# Patient Record
Sex: Male | Born: 1995 | Race: Black or African American | Hispanic: No | Marital: Single | Smoking: Smoker, current status unknown
Health system: Southern US, Community
[De-identification: ages and names within clinical notes are randomized; demographics above are authoritative.]

---

## 2017-07-02 ENCOUNTER — Emergency Department (HOSPITAL_COMMUNITY)
Admission: EM | Admit: 2017-07-02 | Discharge: 2017-07-02 | Disposition: A | Discharge status: Home / Self Care | Payer: Self-pay | Attending: Emergency Medicine | Admitting: Emergency Medicine

## 2017-07-02 ENCOUNTER — Other Ambulatory Visit: Payer: Self-pay

## 2017-07-02 ENCOUNTER — Emergency Department (HOSPITAL_COMMUNITY): Payer: Self-pay

## 2017-07-02 ENCOUNTER — Encounter (HOSPITAL_COMMUNITY): Payer: Self-pay

## 2017-07-02 DIAGNOSIS — N50819 Testicular pain, unspecified: Secondary | ICD-10-CM

## 2017-07-02 DIAGNOSIS — R369 Urethral discharge, unspecified: Secondary | ICD-10-CM

## 2017-07-02 DIAGNOSIS — R360 Urethral discharge without blood: Secondary | ICD-10-CM | POA: Insufficient documentation

## 2017-07-02 DIAGNOSIS — F17200 Nicotine dependence, unspecified, uncomplicated: Secondary | ICD-10-CM | POA: Insufficient documentation

## 2017-07-02 DIAGNOSIS — N50811 Right testicular pain: Secondary | ICD-10-CM | POA: Insufficient documentation

## 2017-07-02 LAB — BASIC METABOLIC PANEL
Anion gap: 8 (ref 5–15)
BUN: 8 mg/dL (ref 6–20)
CALCIUM: 9.6 mg/dL (ref 8.9–10.3)
CHLORIDE: 106 mmol/L (ref 101–111)
CO2: 23 mmol/L (ref 22–32)
CREATININE: 0.96 mg/dL (ref 0.61–1.24)
GFR calc Af Amer: >60 mL/min (ref >60)
GFR calc non Af Amer: >60 mL/min (ref >60)
GLUCOSE: 104 mg/dL — AB (ref 65–99)
Potassium: 4.5 mmol/L (ref 3.5–5.1)
Sodium: 137 mmol/L (ref 135–145)

## 2017-07-02 LAB — CBC WITH DIFFERENTIAL/PLATELET
Basophils Absolute: 0 10*3/uL (ref 0.0–0.1)
Basophils Relative: 1 %
EOS ABS: 0.1 10*3/uL (ref 0.0–0.7)
Eosinophils Relative: 3 %
HEMATOCRIT: 42.9 % (ref 39.0–52.0)
HEMOGLOBIN: 14 g/dL (ref 13.0–17.0)
LYMPHS ABS: 1.6 10*3/uL (ref 0.7–4.0)
Lymphocytes Relative: 41 %
MCH: 28.1 pg (ref 26.0–34.0)
MCHC: 32.6 g/dL (ref 30.0–36.0)
MCV: 86.1 fL (ref 78.0–100.0)
MONO ABS: 0.3 10*3/uL (ref 0.1–1.0)
MONOS PCT: 7 %
Neutro Abs: 1.8 10*3/uL (ref 1.7–7.7)
Neutrophils Relative %: 48 %
Platelets: 292 10*3/uL (ref 150–400)
RBC: 4.98 MIL/uL (ref 4.22–5.81)
RDW: 13.8 % (ref 11.5–15.5)
WBC: 3.8 10*3/uL — ABNORMAL LOW (ref 4.0–10.5)

## 2017-07-02 LAB — URINALYSIS, ROUTINE W REFLEX MICROSCOPIC
BILIRUBIN URINE: NEGATIVE
GLUCOSE, UA: NEGATIVE mg/dL
HGB URINE DIPSTICK: NEGATIVE
KETONES UR: NEGATIVE mg/dL
Leukocytes, UA: NEGATIVE
Nitrite: NEGATIVE
PH: 7 (ref 5.0–8.0)
Protein, ur: NEGATIVE mg/dL
SPECIFIC GRAVITY, URINE: 1.016 (ref 1.005–1.030)

## 2017-07-02 MED ORDER — LIDOCAINE HCL (PF) 1 % IJ SOLN: 2.0000 mL | Freq: Once | INTRAMUSCULAR | Status: DC

## 2017-07-02 MED ORDER — AZITHROMYCIN 250 MG PO TABS
1000.0000 mg | ORAL_TABLET | Freq: Once | ORAL | Status: AC
  Administered 2017-07-02: 1000 mg via ORAL
  Filled 2017-07-02: qty 4

## 2017-07-02 MED ORDER — SODIUM CHLORIDE 0.9 % IV SOLN: 1.0000 g | Freq: Once | INTRAVENOUS | Status: DC

## 2017-07-02 MED ORDER — LIDOCAINE HCL (PF) 1 % IJ SOLN
2.0000 mL | Freq: Once | INTRAMUSCULAR | Status: AC
  Administered 2017-07-02: 2 mL
  Filled 2017-07-02: qty 5

## 2017-07-02 MED ORDER — CEFTRIAXONE SODIUM 250 MG IJ SOLR
250.0000 mg | Freq: Once | INTRAMUSCULAR | Status: AC
  Administered 2017-07-02: 250 mg via INTRAMUSCULAR
  Filled 2017-07-02: qty 250

## 2017-07-02 NOTE — ED Provider Notes (Signed)
Patient placed in Quick Look pathway, seen and evaluated   Chief Complaint: Testicular pain and swelling.  HPI:   Patient reports 1 day of lower abdominal pain that is radiated to his testicles.  Reports bilateral testicle pain with mild swelling.  Denies any recent trauma.  Patient reports some discharge this morning.  He states he is sexually active and concern for STD.  Patient reports using protection.  Denies any dysuria.  Denies any associated nausea, vomiting, fevers, chills.  Denies any associated urinary symptoms including hematuria  ROS: Testicular pain, penile discharge (one)  Physical Exam:   Gen: No distress  Neuro: Awake and Alert  Skin: Warm    Focused Exam: Chaperone present for exam. Circumcisedmale. No penile discharge, erythema, tenderness, lesion, or rash. 2 descended testes without swelling, lesions or rash. No inguinal lymphadenopathy or hernia.  Mild pain noted to palpation without any edema.  No focal abdominal tenderness.  No CVA tenderness.    Initiation of care has begun. The patient has been counseled on the process, plan, and necessity for staying for the completion/evaluation, and the remainder of the medical screening examination   Discussed with the patient that exiting the department prior to completion of the work-up is AMA and there is no guarantee that there are no emergency medical conditions present.     Rise MuLeaphart, Dayvian Blixt T, PA-C 07/02/17 1524    Tegeler, Canary Brimhristopher J, MD 07/03/17 [phone]

## 2017-07-02 NOTE — ED Triage Notes (Signed)
Pt presents with complaints of testicle pain more on the left than the right denies any trauma, swelling or discoloration. NAD in triage

## 2017-07-02 NOTE — ED Provider Notes (Signed)
[facility] EMERGENCY DEPARTMENT Provider Note   CSN: [id number] Arrival date & time: 07/02/17  1428     History   Chief Complaint Chief Complaint  Patient presents with  . Testicle Pain    HPI Willie Montes is a 22 y.o. male.  HPI   22 year old generally healthy male presenting complaining of abdominal pain that radiates to his testicle.  The pain started approximately 1 day ago, radiates to bilateral testicle with mild swelling.  He also noticed some penile discharge this morning.  He is sexually active and was concerned for potential sexually transmitted infection.  Denies any recent trauma.  He has been using protection.  Admits to having sexual activities with at least 8 partners within the past 6 months.  Denies burning urination or urinary frequency urgency.  No report of fever chills nausea vomiting or back pain hematuria or rash.  Denies any prior STI.  No history of kidney stone.  History reviewed. No pertinent past medical history.  There are no active problems to display for this patient.   History reviewed. No pertinent surgical history.     Home Medications    Prior to Admission medications   Not on File    Family History No family history on file.  Social History Social History   Tobacco Use  . Smoking status: Smoker, Current Status Unknown  . Smokeless tobacco: Current User  Substance Use Topics  . Alcohol use: No    Frequency: Never  . Drug use: No     Allergies   Patient has no known allergies.   Review of Systems Review of Systems  All other systems reviewed and are negative.    Physical Exam Updated Vital Signs BP 124/65   Pulse 64   Temp 100.1 F (37.8 C) (Oral)   Resp 16   Wt 81.6 kg (180 lb)   SpO2 100%   Physical Exam  Constitutional: He appears well-developed and well-nourished. No distress.  HENT:  Head: Atraumatic.  Eyes: Conjunctivae are normal.  Neck: Neck supple.  Cardiovascular: Normal rate  and regular rhythm.  Pulmonary/Chest: Effort normal and breath sounds normal.  Abdominal: Soft. He exhibits no distension. There is no tenderness.  Genitourinary:  Genitourinary Comments: Chaperone present during exam.  No inguinal lymphadenopathy or inguinal hernia noted.  Normal circumcised penis with discharge noted at the tip of the meatus.  Testicles are nontender with normal lie.  Scrotum is soft, no perineal pain.  No skin lesions  Neurological: He is alert.  Skin: No rash noted.  Psychiatric: He has a normal mood and affect.  Nursing note and vitals reviewed.    ED Treatments / Results  Labs (all labs ordered are listed, but only abnormal results are displayed) Labs Reviewed  BASIC METABOLIC PANEL - Abnormal; Notable for the following components:      Result Value   Glucose, Bld 104 (*)    All other components within normal limits  CBC WITH DIFFERENTIAL/PLATELET - Abnormal; Notable for the following components:   WBC 3.8 (*)    All other components within normal limits  URINALYSIS, ROUTINE W REFLEX MICROSCOPIC  RPR  HIV ANTIBODY (ROUTINE TESTING)  GC/CHLAMYDIA PROBE AMP ([place]) NOT AT [facility]    EKG  EKG Interpretation None       Radiology US Scrotum W/doppler  Result Date: 07/02/2017 CLINICAL DATA:  Testicular pain EXAM: SCROTAL ULTRASOUND DOPPLER ULTRASOUND OF THE TESTICLES TECHNIQUE: Complete ultrasound examination of the testicles, epididymis, and other  scrotal structures was performed. Color and spectral Doppler ultrasound were also utilized to evaluate blood flow to the testicles. COMPARISON:  None. FINDINGS: Right testicle Measurements: 4.5 x 2.1 x 3.4 cm . No mass or microlithiasis visualized. Left testicle Measurements: 3.9 x 2.2 x 2.9 cm. No mass or microlithiasis visualized. Right epididymis:  Normal in size and appearance. Left epididymis:  Normal in size and appearance. Hydrocele:  None visualized. Varicocele:  None visualized. Pulsed Doppler  interrogation of both testes demonstrates normal low resistance arterial and venous waveforms bilaterally. IMPRESSION: Normal scrotal ultrasound. Electronically Signed   By: Deatra RobinsonKevin  Herman M.D.   On: 07/02/2017 16:04    Procedures Procedures (including critical care time)  Medications Ordered in ED Medications - No data to display   Initial Impression / Assessment and Plan / ED Course  I have reviewed the triage vital signs and the nursing notes.  Pertinent labs & imaging results that were available during my care of the patient were reviewed by me and considered in my medical decision making (see chart for details).     BP 124/65   Pulse 64   Temp 100.1 F (37.8 C) (Oral)   Resp 16   Wt 81.6 kg (180 lb)   SpO2 100%    Final Clinical Impressions(s) / ED Diagnoses   Final diagnoses:  Penile discharge, without blood  Testicular pain    ED Discharge Orders    None     6:21 PM Patient with history of risky sexual behaviors here with lower abdominal discomfort and penile discharge.  Abdominal exam is unremarkable.  He does have some penile discharge noted on exam.  Urine without signs of urinary tract infection, labs are reassuring, scrotal ultrasound without concerning feature.  Patient receive Rocephin and Zithromax for prophylactic treatment of STI.  Recommend sexual abstinence, and notify partner if test positive.   Fayrene Helperran, Karandeep Resende, PA-C 07/02/17 1825    Tegeler, Canary Brimhristopher J, MD 07/03/17 [phone]

## 2017-07-02 NOTE — ED Notes (Signed)
Pt was discharged earlier.  Still in computer.

## 2017-07-02 NOTE — Discharge Instructions (Signed)
You have been evaluated for potential sexually transmitted infection.  You will be notify in the next several days if tested positive for infection.  Make sure to notify partner to get tested if you are positive.  Avoid sexual activity until your symptoms completely resolved.

## 2017-07-03 LAB — HIV ANTIBODY (ROUTINE TESTING W REFLEX): HIV SCREEN 4TH GENERATION: NONREACTIVE

## 2017-07-04 LAB — RPR, QUANT+TP ABS (REFLEX): T Pallidum Abs: NEGATIVE

## 2017-07-04 LAB — RPR: RPR: REACTIVE — AB

## 2017-07-05 LAB — GC/CHLAMYDIA PROBE AMP (~~LOC~~) NOT AT ARMC
Chlamydia: NEGATIVE
NEISSERIA GONORRHEA: NEGATIVE

## 2018-10-06 ENCOUNTER — Emergency Department (HOSPITAL_COMMUNITY)
Admission: EM | Admit: 2018-10-06 | Discharge: 2018-10-07 | Disposition: A | Discharge status: Home / Self Care | Payer: Self-pay | Attending: Emergency Medicine | Admitting: Emergency Medicine

## 2018-10-06 ENCOUNTER — Encounter (HOSPITAL_COMMUNITY): Payer: Self-pay

## 2018-10-06 ENCOUNTER — Emergency Department (HOSPITAL_COMMUNITY): Payer: Self-pay

## 2018-10-06 ENCOUNTER — Other Ambulatory Visit: Payer: Self-pay

## 2018-10-06 DIAGNOSIS — R0602 Shortness of breath: Secondary | ICD-10-CM | POA: Insufficient documentation

## 2018-10-06 DIAGNOSIS — F17200 Nicotine dependence, unspecified, uncomplicated: Secondary | ICD-10-CM | POA: Insufficient documentation

## 2018-10-06 DIAGNOSIS — F17228 Nicotine dependence, chewing tobacco, with other nicotine-induced disorders: Secondary | ICD-10-CM | POA: Insufficient documentation

## 2018-10-06 DIAGNOSIS — R002 Palpitations: Secondary | ICD-10-CM | POA: Insufficient documentation

## 2018-10-06 LAB — BASIC METABOLIC PANEL
Anion gap: 12 (ref 5–15)
BUN: 10 mg/dL (ref 6–20)
CO2: 20 mmol/L — ABNORMAL LOW (ref 22–32)
Calcium: 9.9 mg/dL (ref 8.9–10.3)
Chloride: 104 mmol/L (ref 98–111)
Creatinine, Ser: 1.01 mg/dL (ref 0.61–1.24)
GFR calc Af Amer: >60 mL/min (ref >60)
GFR calc non Af Amer: >60 mL/min (ref >60)
Glucose, Bld: 180 mg/dL — ABNORMAL HIGH (ref 70–99)
Potassium: 3.4 mmol/L — ABNORMAL LOW (ref 3.5–5.1)
Sodium: 136 mmol/L (ref 135–145)

## 2018-10-06 LAB — CBC
HCT: 44.8 % (ref 39.0–52.0)
Hemoglobin: 15 g/dL (ref 13.0–17.0)
MCH: 28.1 pg (ref 26.0–34.0)
MCHC: 33.5 g/dL (ref 30.0–36.0)
MCV: 84.1 fL (ref 80.0–100.0)
Platelets: 337 10*3/uL (ref 150–400)
RBC: 5.33 MIL/uL (ref 4.22–5.81)
RDW: 12.4 % (ref 11.5–15.5)
WBC: 5.8 10*3/uL (ref 4.0–10.5)
nRBC: 0 % (ref 0.0–0.2)

## 2018-10-06 MED ORDER — SODIUM CHLORIDE 0.9% FLUSH: 3.0000 mL | Freq: Once | INTRAVENOUS | Status: DC

## 2018-10-06 NOTE — ED Triage Notes (Signed)
Pt reports shortness of breath and rapid heart beat intermittently for months. Pt in no distress. States he has some numbness in limbs when this occurs. Pt also reports SOB worsens after eating. Skin warm and dry. Resp e/u at this time. Pt is noted to be tachycardic in triage.

## 2018-10-06 NOTE — ED Notes (Addendum)
Willie Montes, [phone], Mother of the child

## 2018-10-07 LAB — TSH: TSH: 1.72 u[IU]/mL (ref 0.350–4.500)

## 2018-10-07 LAB — T4, FREE: Free T4: 1.14 ng/dL (ref 0.82–1.77)

## 2018-10-07 MED ORDER — LORAZEPAM 0.5 MG PO TABS: 0.5000 mg | ORAL_TABLET | Freq: Three times a day (TID) | ORAL | 0 refills | Status: AC | PRN

## 2018-10-07 NOTE — ED Provider Notes (Signed)
Patient seen/examined in the Emergency Department in conjunction with Advanced Practice Provider [place] Patient reports palpations and SOB Exam : awake/alert, no distress, no added heart sounds, lung sounds CTA-B Plan: continue to monitor Repeat ekg Ambulate with pulse ox If improved he can be discharged    Zadie Rhine, MD 10/07/18 0234

## 2018-10-07 NOTE — ED Notes (Addendum)
Pt pulse oximetry was 98-100% while ambulating.

## 2018-10-07 NOTE — ED Provider Notes (Signed)
[facility] Provider [facility]   CSN: [id number] Arrival date & time: 10/06/18  2200    History   Chief Complaint Chief Complaint  Patient presents with  . Shortness of Breath    HPI Willie Montes is a 23 y.o. male.     Patient to ED for evaluation of symptoms of palpitations described as episodes of heart racing and SOB. They can occur at any time and last for varying amounts of time, from a few minutes to a few hours. He states he had similar symptoms 1-2 months ago that resolved and started again over the last several days. No fever, cough, congestion, vomiting, syncope or near syncope, headache. He stopped smoking both tobacco and marijuana in the past week. No other drug use. He is not taking any OTC medications and reports his caffeine use is minimal. No history of anxiety or panic.   The history is provided by the patient. No language interpreter was used.  Shortness of Breath  Associated symptoms: no diaphoresis, no fever, no headaches and no vomiting     History reviewed. No pertinent past medical history.  There are no active problems to display for this patient.   History reviewed. No pertinent surgical history.      Home Medications    Prior to Admission medications   Not on File    Family History History reviewed. No pertinent family history.  Social History Social History   Tobacco Use  . Smoking status: Smoker, Current Status Unknown  . Smokeless tobacco: Current User  Substance Use Topics  . Alcohol use: No    Frequency: Never  . Drug use: No     Allergies   Patient has no known allergies.   Review of Systems Review of Systems  Constitutional: Negative for chills, diaphoresis and fever.  HENT: Negative.   Respiratory: Positive for shortness of breath.   Cardiovascular: Positive for palpitations.  Gastrointestinal: Negative.  Negative for vomiting.  Musculoskeletal: Negative.   Neurological: Negative.   Negative for syncope, weakness and headaches.     Physical Exam Updated Vital Signs BP (!) 150/85   Pulse 78   Temp 98.8 F (37.1 C) (Oral)   Resp 13   SpO2 100%   Physical Exam Vitals signs and nursing note reviewed.  Constitutional:      Appearance: He is well-developed. He is not ill-appearing.  HENT:     Head: Normocephalic.  Neck:     Musculoskeletal: Normal range of motion and neck supple.  Cardiovascular:     Rate and Rhythm: Regular rhythm. Tachycardia present.     Heart sounds: No murmur.  Pulmonary:     Effort: Pulmonary effort is normal.     Breath sounds: Normal breath sounds. No wheezing, rhonchi or rales.  Chest:     Chest wall: No tenderness.  Abdominal:     General: Bowel sounds are normal.     Palpations: Abdomen is soft.     Tenderness: There is no abdominal tenderness. There is no guarding or rebound.  Musculoskeletal: Normal range of motion.  Skin:    General: Skin is warm and dry.  Neurological:     General: No focal deficit present.     Mental Status: He is alert and oriented to person, place, and time.      ED Treatments / Results  Labs (all labs ordered are listed, but only abnormal results are displayed) Labs Reviewed  BASIC METABOLIC PANEL - Abnormal; Notable for  the following components:      Result Value   Potassium 3.4 (*)    CO2 20 (*)    Glucose, Bld 180 (*)    All other components within normal limits  CBC  T4, FREE  TSH    EKG EKG Interpretation  Date/Time:  Friday Oct 07 2018 02:51:13 EDT Ventricular Rate:  83 PR Interval:  132 QRS Duration: 91 QT Interval:  361 QTC Calculation: 425 R Axis:   85 Text Interpretation:  Sinus rhythm Right atrial enlargement ST elev, probable normal early repol pattern heart rate is improved Confirmed by Zadie Rhine ([id number]) on 10/07/2018 3:04:41 AM   Radiology Dg Chest 2 View  Result Date: 10/06/2018 CLINICAL DATA:  Shortness of breath for several months EXAM: CHEST - 2 VIEW  COMPARISON:  None. FINDINGS: The heart size and mediastinal contours are within normal limits. Both lungs are clear. The visualized skeletal structures are unremarkable. IMPRESSION: No active cardiopulmonary disease. Electronically Signed   By: Alcide Clever M.D.   On: 10/06/2018 23:06    Procedures Procedures (including critical care time)  Medications Ordered in ED Medications  sodium chloride flush (NS) 0.9 % injection 3 mL (has no administration in time range)     Initial Impression / Assessment and Plan / ED Course  I have reviewed the triage vital signs and the nursing notes.  Pertinent labs & imaging results that were available during my care of the patient were reviewed by me and considered in my medical decision making (see chart for details).        The patient is here for evaluation palpitations and SOB x months intermittently. No pain, fever, cough, congestion. No h/o anxiety. He does not feel he is under stress.   He is tachycardic on arrival, which improves over time. HR 78 on the monitor when I enter the room that increases to 112 during HPI. NAD.   Labs are WNL, no anemia. EKG shows sinus tach. CXR clear. He ambulates without desaturation or significant symptoms of SOB.   He has been seen by Dr. Bebe Shaggy. He is felt appropriate for discharge home. Will provide #5 Ativan 0.5 mg. REfer to cardiology for holter monitoring. Feel symptoms are likely anxiety related.  Final Clinical Impressions(s) / ED Diagnoses   Final diagnoses:  None   1. Palpitations  ED Discharge Orders    None       Elpidio Anis, Cordelia Poche 10/07/18 0404    Zadie Rhine, MD 10/07/18 [phone]

## 2018-10-07 NOTE — Discharge Instructions (Addendum)
Take Ativan if you develop symptoms to see if it helps. You can take one as needed, no more frequently than every 8 hours.   Follow up with cardiology for further evaluation of palpitations.

## 2020-03-14 IMAGING — DX CHEST - 2 VIEW
2 series · 2 of 2 positions shown · non-contrast
Comparison: None.

CLINICAL DATA: Shortness of breath for several months

EXAM:
CHEST - 2 VIEW

[chest pa]
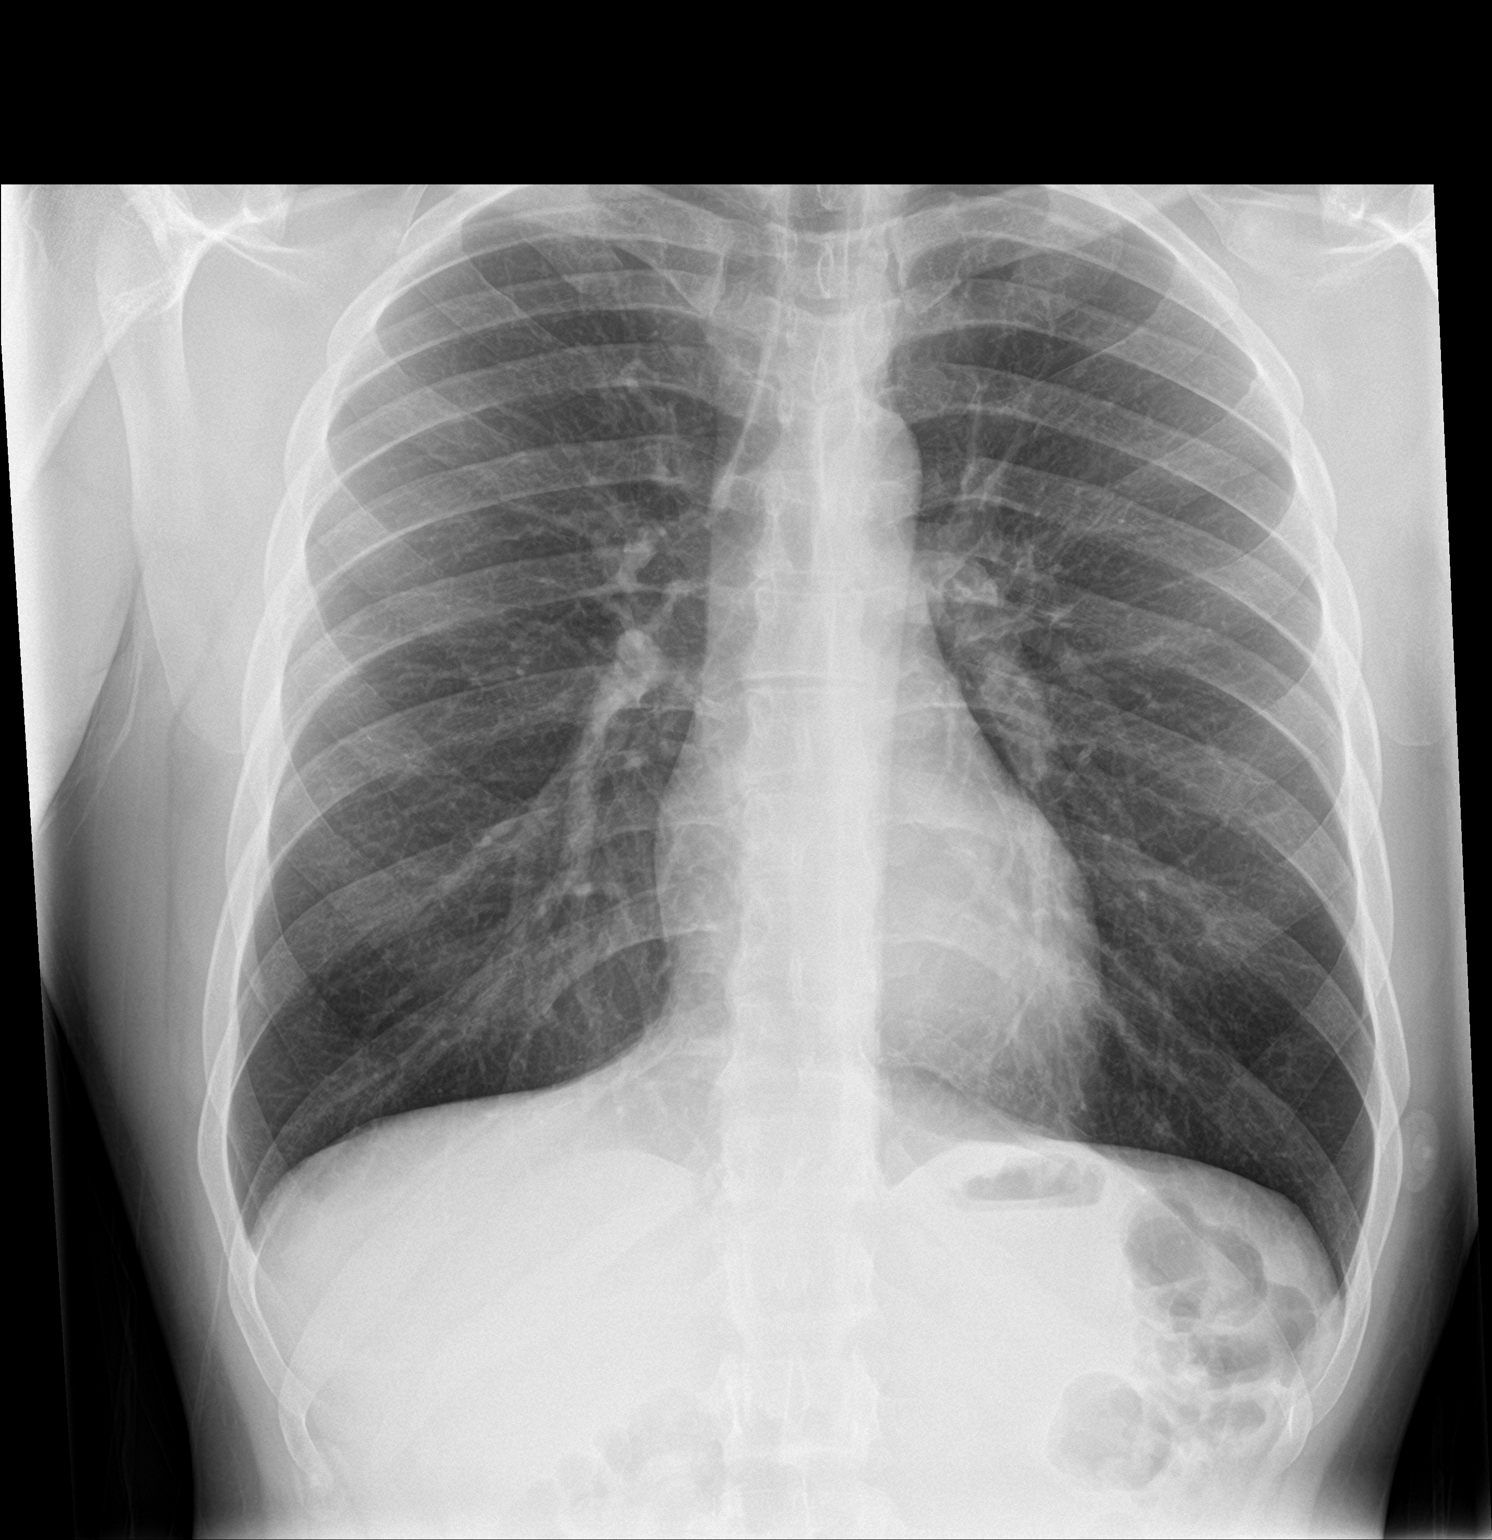

[chest lat]
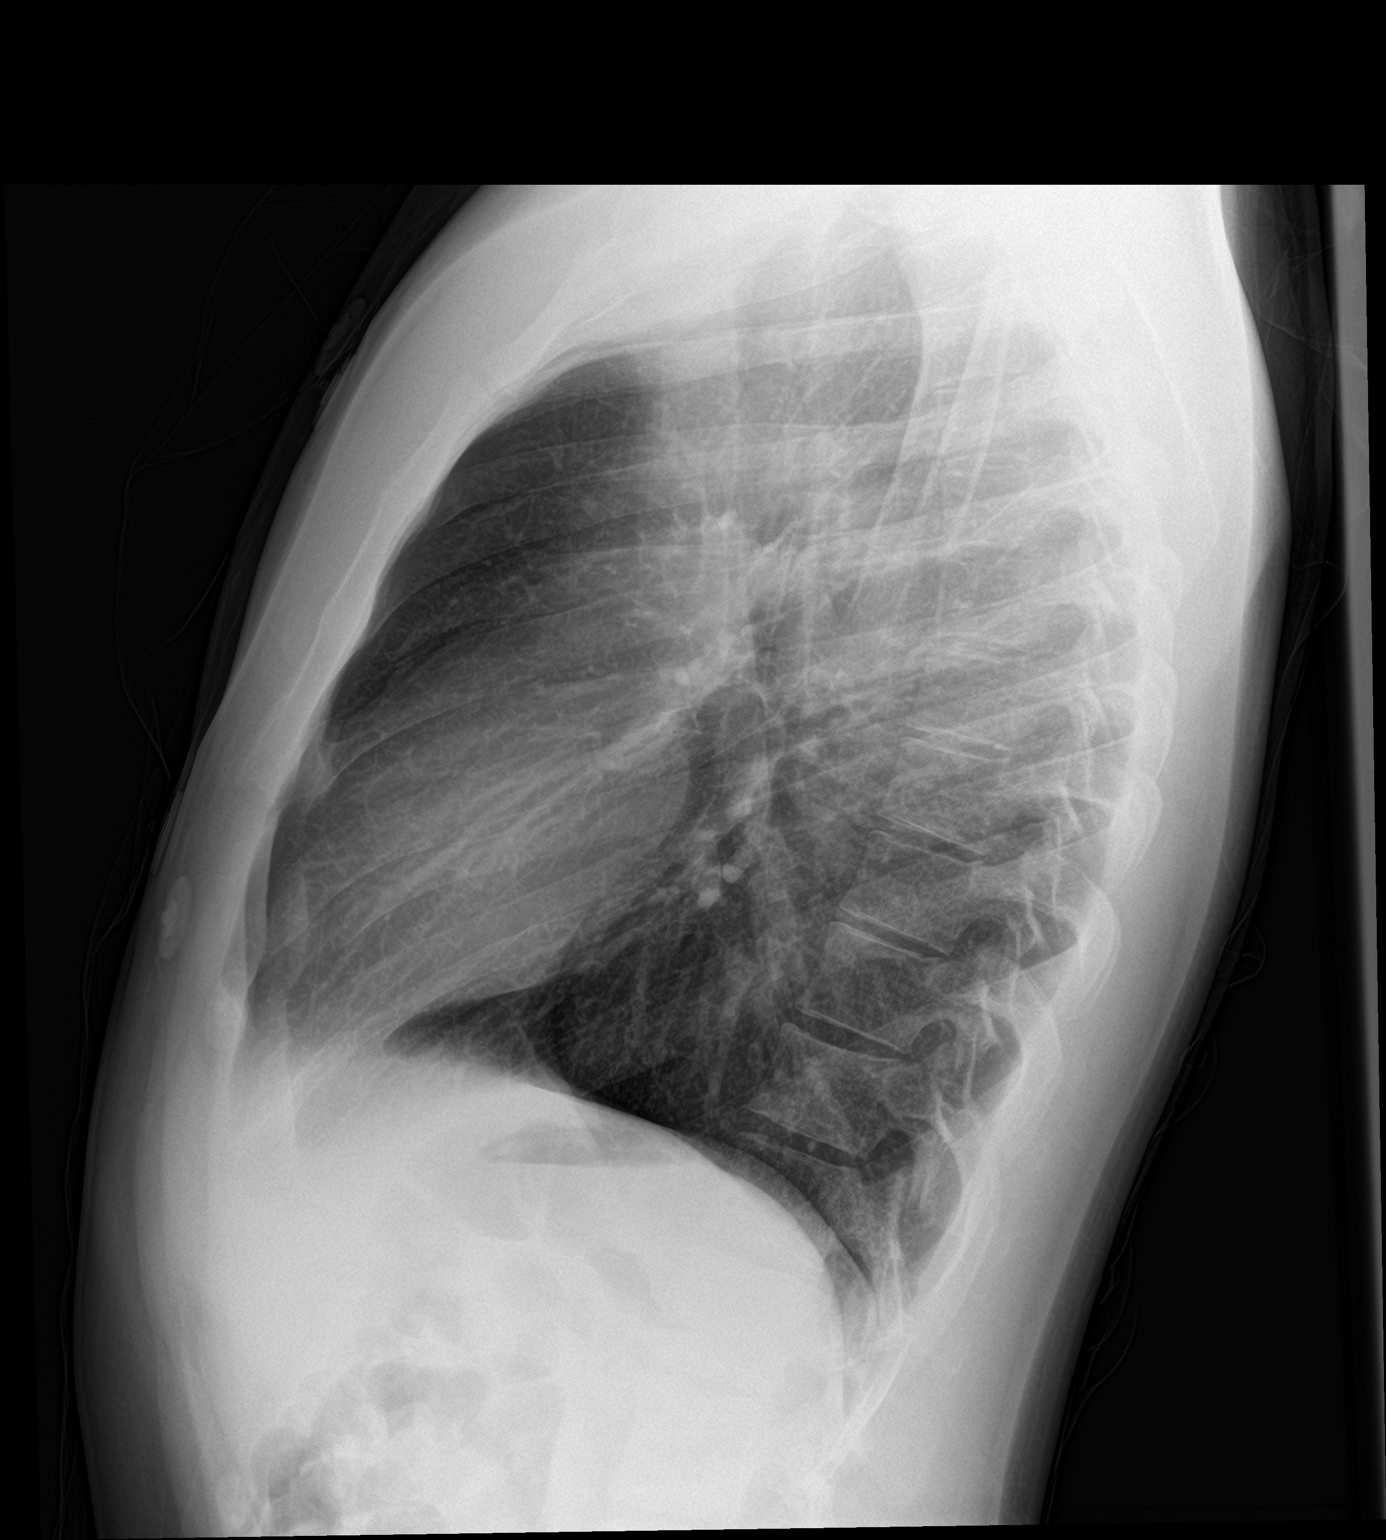

[2 of 2 positions shown; findings below may reference images not displayed]

FINDINGS: The heart size and mediastinal contours are within normal limits.
Both lungs are clear. The visualized skeletal structures are
unremarkable.
IMPRESSION: No active cardiopulmonary disease.

## 2020-05-07 ENCOUNTER — Encounter (HOSPITAL_COMMUNITY): Payer: Self-pay | Admitting: Emergency Medicine

## 2020-05-07 ENCOUNTER — Emergency Department (HOSPITAL_COMMUNITY)
Admission: EM | Admit: 2020-05-07 | Discharge: 2020-05-08 | Disposition: A | Discharge status: Home / Self Care | Payer: HRSA Program | Attending: Emergency Medicine | Admitting: Emergency Medicine

## 2020-05-07 ENCOUNTER — Other Ambulatory Visit: Payer: Self-pay

## 2020-05-07 DIAGNOSIS — U071 COVID-19: Secondary | ICD-10-CM | POA: Insufficient documentation

## 2020-05-07 DIAGNOSIS — Z5321 Procedure and treatment not carried out due to patient leaving prior to being seen by health care provider: Secondary | ICD-10-CM | POA: Diagnosis not present

## 2020-05-07 DIAGNOSIS — R509 Fever, unspecified: Secondary | ICD-10-CM | POA: Diagnosis present

## 2020-05-07 LAB — RESP PANEL BY RT-PCR (FLU A&B, COVID) ARPGX2
Influenza A by PCR: NEGATIVE
Influenza B by PCR: NEGATIVE
SARS Coronavirus 2 by RT PCR: POSITIVE — AB

## 2020-05-07 MED ORDER — ACETAMINOPHEN 325 MG PO TABS
650.0000 mg | ORAL_TABLET | Freq: Once | ORAL | Status: AC | PRN
  Administered 2020-05-07: 650 mg via ORAL
  Filled 2020-05-07: qty 2

## 2020-05-07 NOTE — ED Triage Notes (Signed)
Pt c/o fever, chills, and generalized body aches that started yesterday. Febrile in triage.

## 2020-05-08 NOTE — ED Notes (Signed)
Pt LWBS 

## 2020-05-08 NOTE — ED Notes (Signed)
Pt called several times for vitals no answer.
